# Patient Record
Sex: Male | Born: 2005 | Race: Black or African American | Hispanic: No | Marital: Single | Smoking: Never smoker
Health system: Southern US, Community
[De-identification: ages and names within clinical notes are randomized; demographics above are authoritative.]

## PROBLEM LIST (undated history)

## (undated) DIAGNOSIS — J45909 Unspecified asthma, uncomplicated: Secondary | ICD-10-CM

## (undated) DIAGNOSIS — H903 Sensorineural hearing loss, bilateral: Secondary | ICD-10-CM

## (undated) HISTORY — DX: Sensorineural hearing loss, bilateral: H90.3

---
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
## 2024-07-26 ENCOUNTER — Encounter: Payer: Self-pay | Admitting: *Deleted
